# Patient Record
Sex: Male | Born: 1990 | Race: White | Hispanic: No | Marital: Married | Smoking: Never smoker
Health system: Southern US, Community
[De-identification: ages and names within clinical notes are randomized; demographics above are authoritative.]

## PROBLEM LIST (undated history)

## (undated) HISTORY — PX: WISDOM TOOTH EXTRACTION: SHX21

## (undated) HISTORY — PX: SHOULDER ARTHROSCOPY W/ LABRAL REPAIR: SHX2399

---
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
## 2011-12-11 ENCOUNTER — Ambulatory Visit
Admission: RE | Admit: 2011-12-11 | Discharge: 2011-12-11 | Disposition: A | Discharge status: Home / Self Care | Payer: No Typology Code available for payment source | Source: Ambulatory Visit | Attending: Occupational Medicine | Admitting: Occupational Medicine

## 2011-12-11 ENCOUNTER — Other Ambulatory Visit: Payer: Self-pay | Admitting: Occupational Medicine

## 2011-12-11 DIAGNOSIS — Z021 Encounter for pre-employment examination: Secondary | ICD-10-CM

## 2012-06-01 ENCOUNTER — Encounter (HOSPITAL_COMMUNITY): Payer: Self-pay | Admitting: *Deleted

## 2012-06-01 ENCOUNTER — Emergency Department (HOSPITAL_COMMUNITY)
Admission: EM | Admit: 2012-06-01 | Discharge: 2012-06-01 | Disposition: A | Discharge status: Home / Self Care | Payer: 59 | Source: Home / Self Care | Attending: Family Medicine | Admitting: Family Medicine

## 2012-06-01 DIAGNOSIS — F488 Other specified nonpsychotic mental disorders: Secondary | ICD-10-CM

## 2012-06-01 LAB — GLUCOSE, CAPILLARY: Glucose-Capillary: 93 mg/dL (ref 70–99)

## 2012-06-01 LAB — POCT I-STAT, CHEM 8
Creatinine, Ser: 1.3 mg/dL (ref 0.50–1.35)
HCT: 49 % (ref 39.0–52.0)
Hemoglobin: 16.7 g/dL (ref 13.0–17.0)
Potassium: 3.4 mEq/L — ABNORMAL LOW (ref 3.5–5.1)
Sodium: 143 mEq/L (ref 135–145)

## 2012-06-01 NOTE — ED Provider Notes (Signed)
History     CSN: [id number]  Arrival date & time 06/01/12  4098   First MD Initiated Contact with Patient 06/01/12 1838      Chief Complaint  Patient presents with  . Polydipsia  . Fatigue    (Consider location/radiation/quality/duration/timing/severity/associated sxs/prior treatment) Patient is a 21 y.o. male presenting with general illness. The history is provided by the patient.  Illness  The current episode started more than 2 weeks ago. The onset was gradual. The problem has been unchanged. The problem is moderate. Pertinent negatives include no fever and no headaches. Associated symptoms comments: Fatigue, polydipsia, polyuria, is in [employer] training program., no energy drinks or caffeine, gets plenty of sleep.Marland Kitchen    History reviewed. No pertinent past medical history.  History reviewed. No pertinent past surgical history.  No family history on file.  History  Substance Use Topics  . Smoking status: Never Smoker   . Smokeless tobacco: Not on file  . Alcohol Use: Yes     Comment: "very rarely"      Review of Systems  Constitutional: Positive for fatigue. Negative for fever.  Neurological: Negative for dizziness, weakness, numbness and headaches.  Psychiatric/Behavioral: Negative.     Allergies  Review of patient's allergies indicates no known allergies.  Home Medications   Current Outpatient Rx  Name  Route  Sig  Dispense  Refill  . IBUPROFEN PO   Oral   Take by mouth.           BP 123/83  Pulse 82  Temp 98.3 F (36.8 C) (Oral)  Resp 18  SpO2 100%  Physical Exam  Nursing note and vitals reviewed. Constitutional: He is oriented to person, place, and time. He appears well-developed and well-nourished. No distress.  HENT:  Head: Normocephalic.  Mouth/Throat: Oropharynx is clear and moist.  Eyes: Conjunctivae normal are normal. Pupils are equal, round, and reactive to light.  Neck: Normal range of motion. Neck supple.  Cardiovascular: Normal rate,  normal heart sounds and intact distal pulses.   Pulmonary/Chest: Breath sounds normal.  Abdominal: Soft. Bowel sounds are normal.  Musculoskeletal: He exhibits no edema.  Lymphadenopathy:    He has no cervical adenopathy.  Neurological: He is alert and oriented to person, place, and time.  Skin: Skin is warm and dry.    ED Course  Procedures (including critical care time)  Labs Reviewed  POCT I-STAT, CHEM 8 - Abnormal; Notable for the following:    Potassium 3.4 (*)     Calcium, Ion 1.25 (*)     All other components within normal limits  GLUCOSE, CAPILLARY   No results found.   1. Nervous fatigue       MDM  cbg--wnl at 93 U/a wnl i-stat-wnl.       Linna Hoff, MD 06/01/12 2013

## 2012-06-01 NOTE — ED Notes (Signed)
C/O feeling very fatigued despite adequate sleep over past 2-3 wks, along with excessive thirst and an increase in frequency of urination; has been having to wake in middle of night to urinate.  Last night woke in middle of night "completely drenched in sweat".

## 2012-06-02 LAB — POCT URINALYSIS DIP (DEVICE)
Bilirubin Urine: NEGATIVE
Glucose, UA: NEGATIVE mg/dL
Ketones, ur: NEGATIVE mg/dL
Leukocytes, UA: NEGATIVE

## 2012-10-07 IMAGING — CR DG CHEST 1V
2 series · 2 of 2 positions shown · non-contrast
Comparison: None.

CLINICAL DATA: Pre employment physical exam

CHEST - 1 VIEW

[view not recorded (1 of 2)]
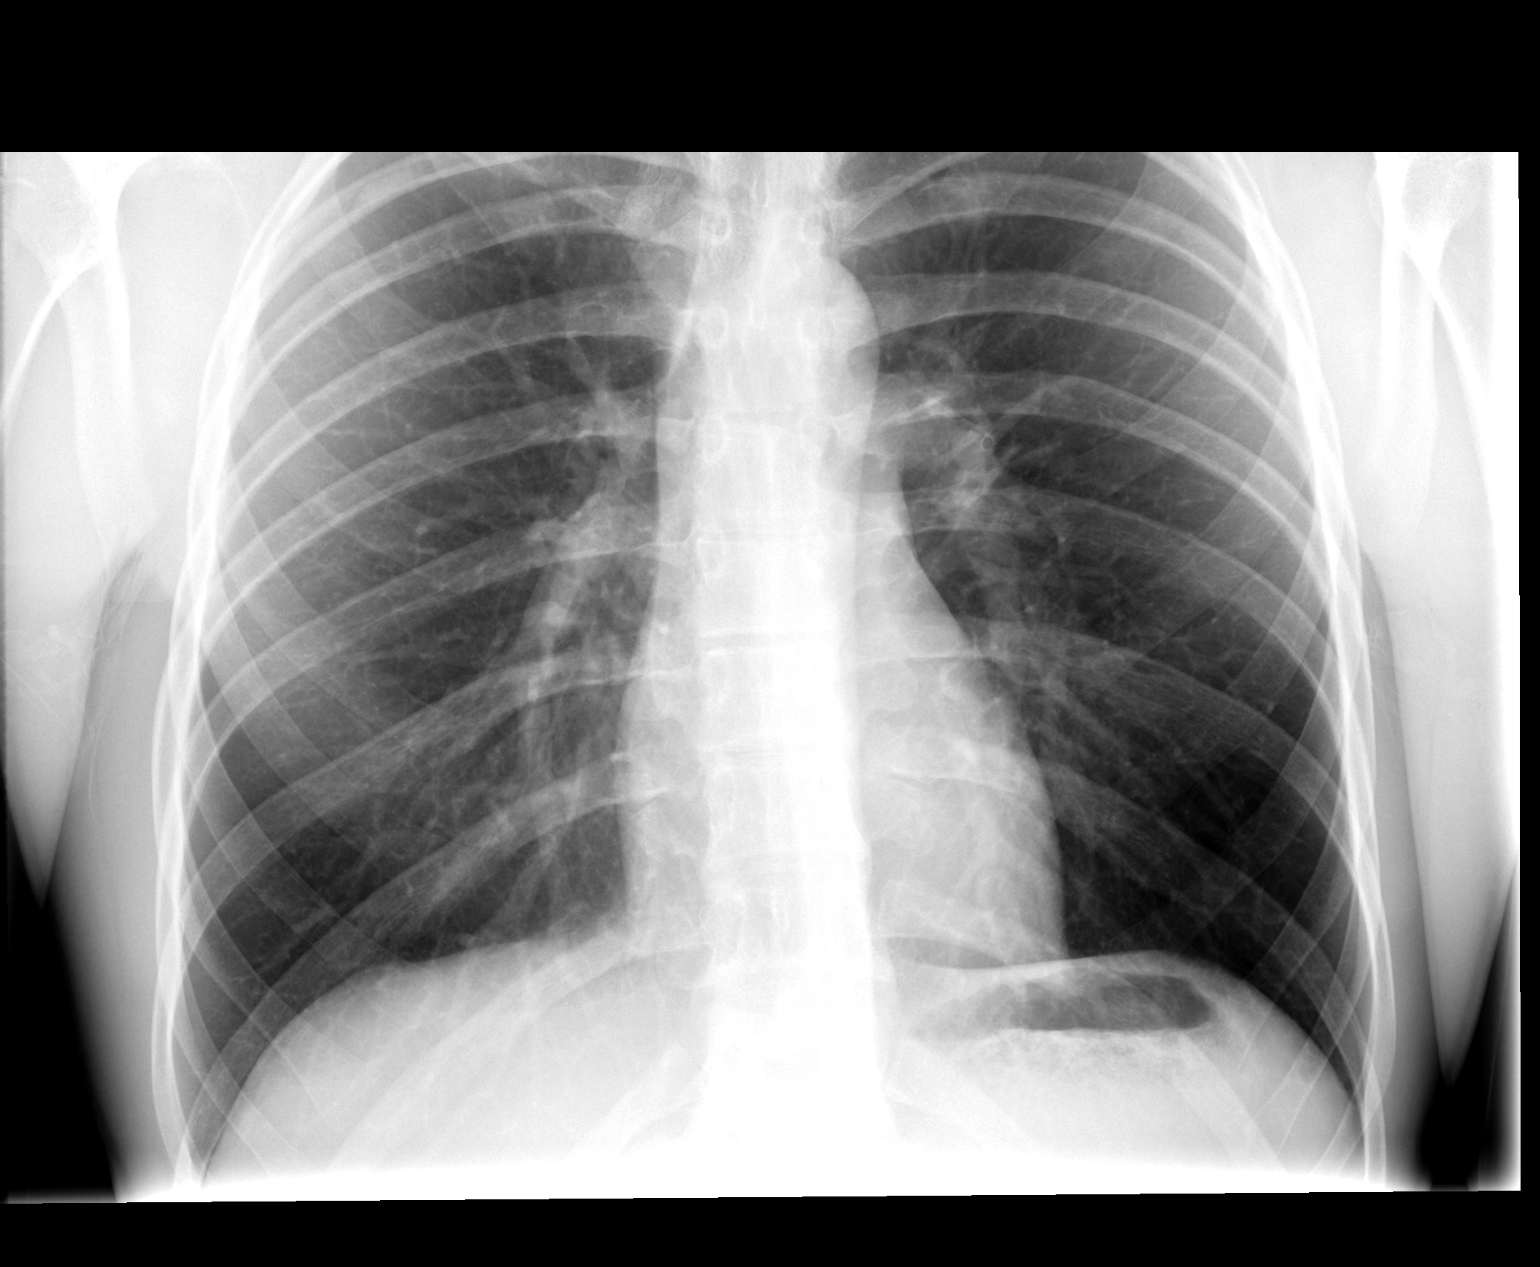

[view not recorded (2 of 2)]
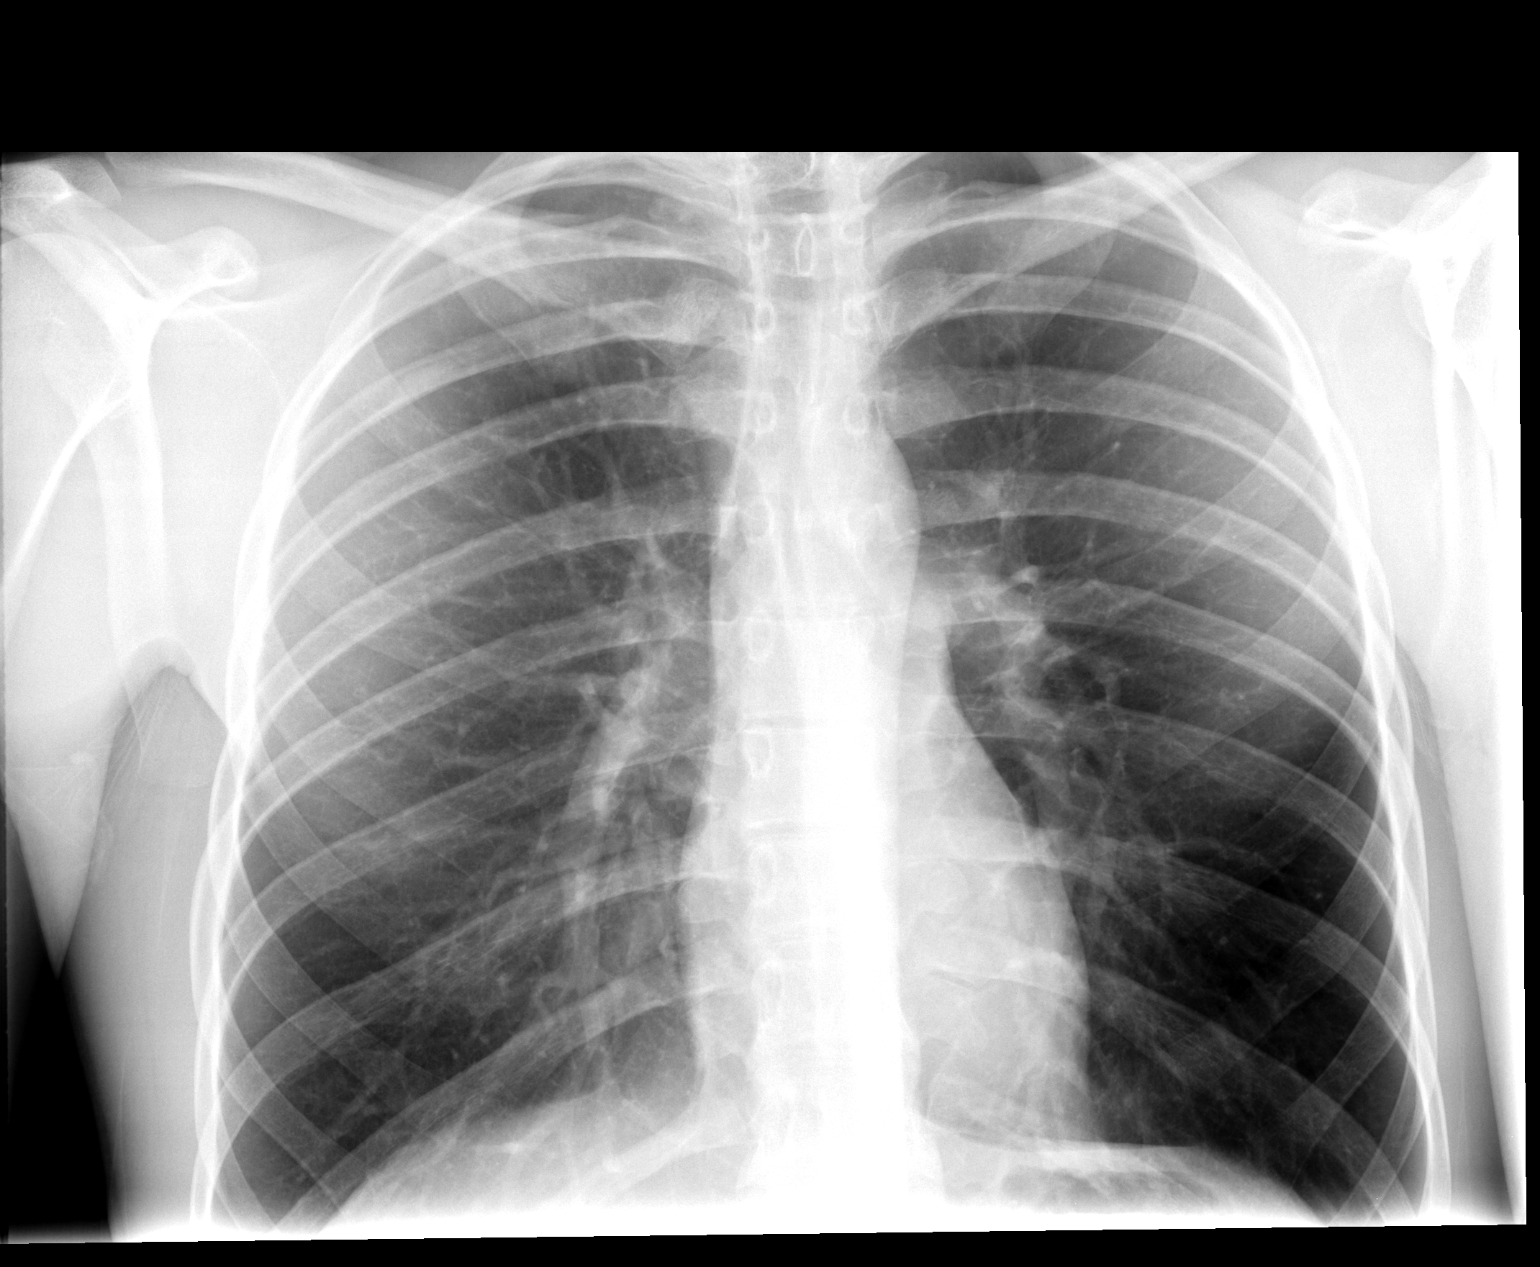

[2 of 2 positions shown; findings below may reference images not displayed]

FINDINGS: The lungs are clear.  Mediastinal contours appear normal.
The heart is within normal limits in size.  No acute bony
abnormality is seen.  There is a sclerotic focus within the
posterior right fourth rib most consistent with a benign process.
IMPRESSION: No active lung disease.

## 2016-07-22 ENCOUNTER — Ambulatory Visit
Admission: RE | Admit: 2016-07-22 | Discharge: 2016-07-22 | Disposition: A | Discharge status: Home / Self Care | Payer: Worker's Compensation | Source: Ambulatory Visit | Attending: Nurse Practitioner | Admitting: Nurse Practitioner

## 2016-07-22 ENCOUNTER — Other Ambulatory Visit: Payer: Self-pay | Admitting: Nurse Practitioner

## 2016-07-22 DIAGNOSIS — R52 Pain, unspecified: Secondary | ICD-10-CM

## 2016-07-22 DIAGNOSIS — R609 Edema, unspecified: Secondary | ICD-10-CM

## 2016-12-24 ENCOUNTER — Ambulatory Visit (HOSPITAL_COMMUNITY)
Admission: EM | Admit: 2016-12-24 | Discharge: 2016-12-24 | Disposition: A | Discharge status: Home / Self Care | Payer: Commercial Managed Care - HMO | Attending: Family Medicine | Admitting: Family Medicine

## 2016-12-24 ENCOUNTER — Encounter (HOSPITAL_COMMUNITY): Payer: Self-pay | Admitting: *Deleted

## 2016-12-24 DIAGNOSIS — R0789 Other chest pain: Secondary | ICD-10-CM

## 2016-12-24 MED ORDER — NAPROXEN 500 MG PO TABS: 500.0000 mg | ORAL_TABLET | Freq: Two times a day (BID) | ORAL | 0 refills | Status: DC

## 2016-12-24 NOTE — ED Provider Notes (Signed)
CSN: [id number]     Arrival date & time 12/24/16  1044 History   None    Chief Complaint  Patient presents with  . Chest Pain   (Consider location/radiation/quality/duration/timing/severity/associated sxs/prior Treatment) Patient c/o left sided chest wall pain x 3 days.  Pain is worse with cough or taking deep breathe.   The history is provided by the patient.  Chest Pain  Pain location:  L chest Pain quality: aching   Pain radiates to:  Does not radiate Pain severity:  Moderate Onset quality:  Sudden Duration:  3 days Timing:  Constant Progression:  Worsening Chronicity:  New Relieved by:  Nothing Worsened by:  Nothing   History reviewed. No pertinent past medical history. History reviewed. No pertinent surgical history. History reviewed. No pertinent family history. Social History  Substance Use Topics  . Smoking status: Never Smoker  . Smokeless tobacco: Not on file  . Alcohol use Yes     Comment: "very rarely"    Review of Systems  Constitutional: Negative.   HENT: Negative.   Eyes: Negative.   Respiratory: Negative.   Cardiovascular: Positive for chest pain.  Gastrointestinal: Negative.   Endocrine: Negative.   Genitourinary: Negative.   Musculoskeletal: Negative.   Allergic/Immunologic: Negative.   Neurological: Negative.   Hematological: Negative.   Psychiatric/Behavioral: Negative.     Allergies  Patient has no known allergies.  Home Medications   Prior to Admission medications   Medication Sig Start Date End Date Taking? Authorizing Provider  IBUPROFEN PO Take by mouth.    [provider]  naproxen (NAPROSYN) 500 MG tablet Take 1 tablet (500 mg total) by mouth 2 (two) times daily with a meal. 12/24/16   Oxford, Anselm PancoastWilliam J, FNP   Meds Ordered and Administered this Visit  Medications - No data to display  BP 132/84 (BP Location: Left Arm)   Pulse 84   Temp 98 F (36.7 C)   Resp 18   SpO2 100%  No data found.   Physical Exam   Constitutional: He appears well-developed and well-nourished.  HENT:  Head: Normocephalic and atraumatic.  Eyes: Conjunctivae and EOM are normal. Pupils are equal, round, and reactive to light.  Neck: Normal range of motion. Neck supple.  Cardiovascular: Normal rate, regular rhythm and normal heart sounds.   Pulmonary/Chest: Effort normal and breath sounds normal.  Musculoskeletal: He exhibits tenderness.  TTP left anterior chest wall.  Nursing note and vitals reviewed.   Urgent Care Course     Procedures (including critical care time)  Labs Review Labs Reviewed - No data to display  Imaging Review No results found.   Visual Acuity Review  Right Eye Distance:   Left Eye Distance:   Bilateral Distance:    Right Eye Near:   Left Eye Near:    Bilateral Near:         MDM   1. Chest wall pain    Naprosyn one po bid x 10 days #20      Deatra CanterOxford, William J, FNP 12/24/16 1228

## 2016-12-24 NOTE — ED Triage Notes (Signed)
Pt  Reports   l  Sided   Chest  Pain   X   3  Days   Cramping  Type  Pain   At  First        Pain  Is   Worse   And   When takes  A  Deep  Breath  On  Certain  posistions

## 2017-05-19 IMAGING — CR DG FINGER LITTLE 2+V*R*
3 series · 3 of 3 positions shown · non-contrast
Comparison: None.

CLINICAL DATA: Closed car door on right fifth finger with pain to
the nail bed

EXAM:
RIGHT LITTLE FINGER 2+V

[x finger pa right]
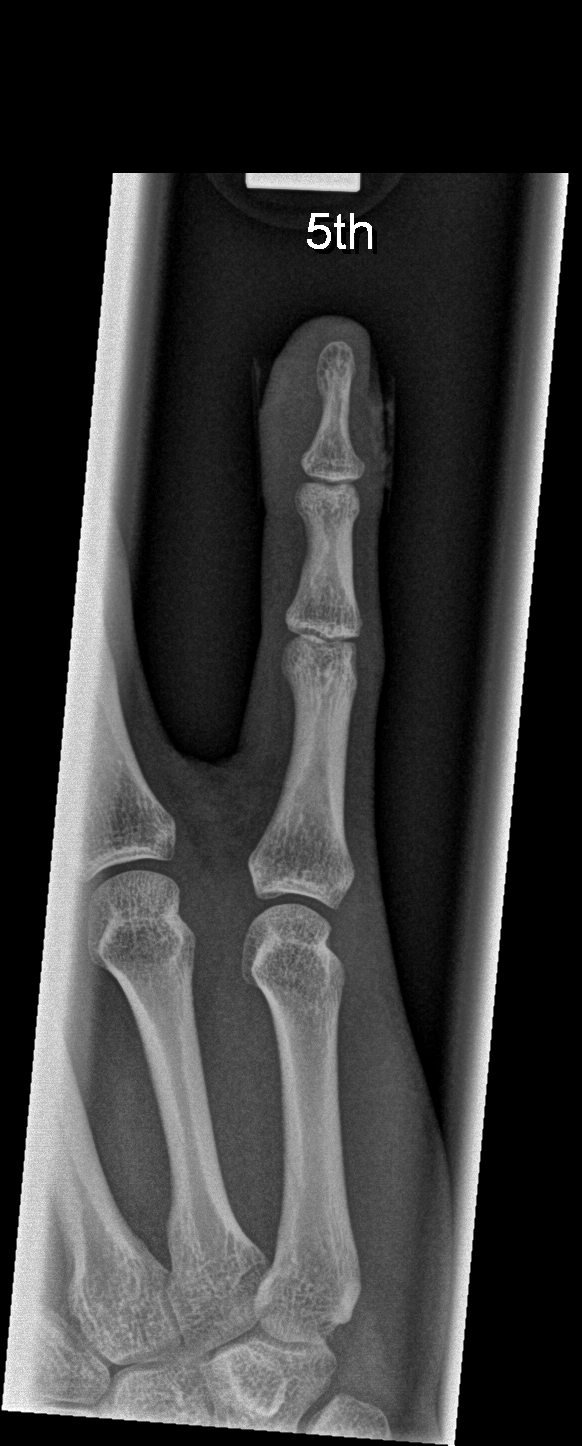

[x finger obl right]
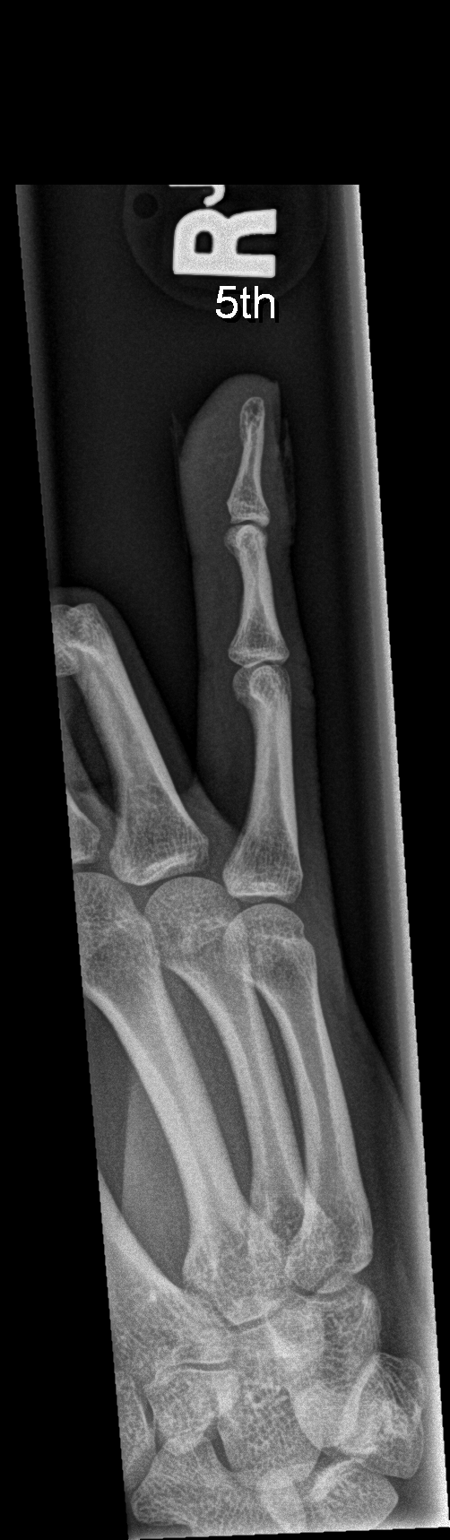

[x finger lat right]
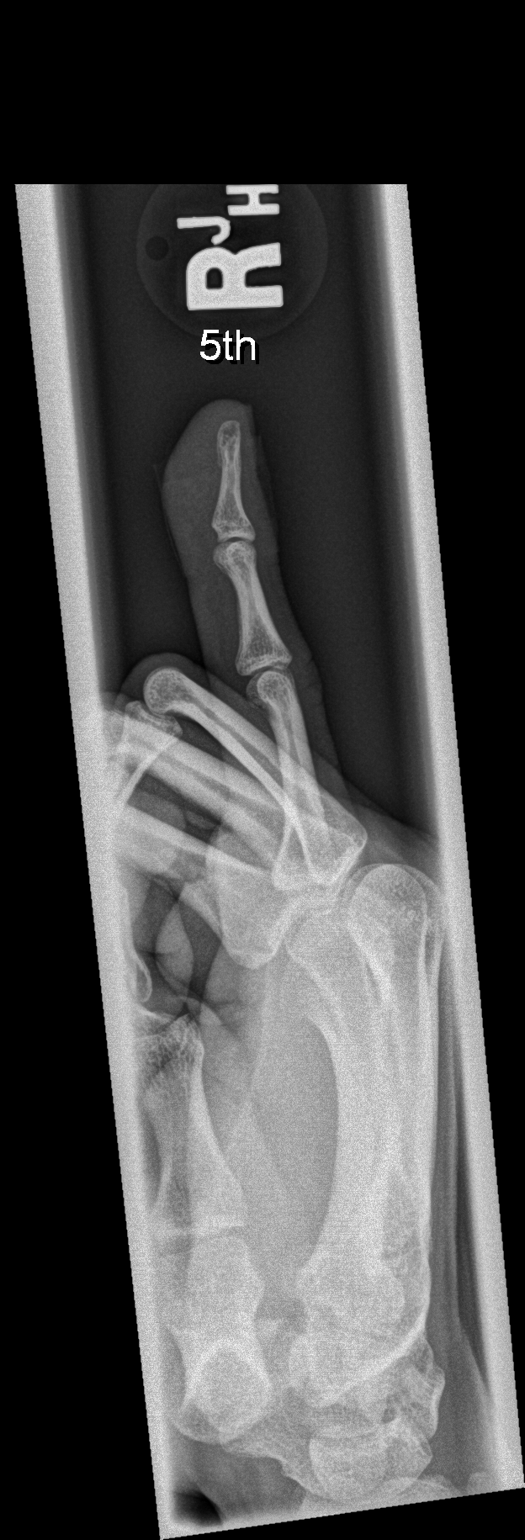

[3 of 3 positions shown; findings below may reference images not displayed]

FINDINGS: There is no evidence of fracture or dislocation. There is no
evidence of arthropathy or other focal bone abnormality. Soft
tissues are unremarkable.
IMPRESSION: Negative.

## 2019-10-02 ENCOUNTER — Ambulatory Visit: Payer: Self-pay | Attending: Internal Medicine

## 2019-10-02 DIAGNOSIS — Z23 Encounter for immunization: Secondary | ICD-10-CM

## 2019-10-02 NOTE — Progress Notes (Signed)
   Covid-19 Vaccination Clinic  Name:  MANUS WEEDMAN    MRN: [id number] DOB: 04-28-1991  10/02/2019  Mr. Hinkle was observed post Covid-19 immunization for 15 minutes without incident. He was provided with Vaccine Information Sheet and instruction to access the V-Safe system.   Mr. Salemi was instructed to call 911 with any severe reactions post vaccine: Marland Kitchen Difficulty breathing  . Swelling of face and throat  . A fast heartbeat  . A bad rash all over body  . Dizziness and weakness   Immunizations Administered    Name Date Dose VIS Date Route   Pfizer COVID-19 Vaccine 10/02/2019  8:56 AM 0.3 mL 06/25/2019 Intramuscular   Manufacturer: [employer], [employer]   Lot: [id number]   NDC: [address]-5

## 2019-10-26 ENCOUNTER — Ambulatory Visit: Payer: Self-pay | Attending: Internal Medicine

## 2019-10-26 DIAGNOSIS — Z23 Encounter for immunization: Secondary | ICD-10-CM

## 2019-10-26 NOTE — Progress Notes (Signed)
   Covid-19 Vaccination Clinic  Name:  Steven Bradford    MRN: [id number] DOB: [birth year]  10/26/2019  Steven Bradford was observed post Covid-19 immunization for 15 minutes without incident. He was provided with Vaccine Information Sheet and instruction to access the V-Safe system.   Steven Bradford was instructed to call 911 with any severe reactions post vaccine: Marland Kitchen Difficulty breathing  . Swelling of face and throat  . A fast heartbeat  . A bad rash all over body  . Dizziness and weakness   Immunizations Administered    Name Date Dose VIS Date Route   Pfizer COVID-19 Vaccine 10/26/2019  2:55 PM 0.3 mL 06/25/2019 Intramuscular   Manufacturer: [employer], [employer]   Lot: [id number]   NDC: [address]-1

## 2020-07-18 ENCOUNTER — Encounter: Payer: Self-pay | Admitting: Family Medicine

## 2020-07-18 ENCOUNTER — Other Ambulatory Visit: Payer: Self-pay

## 2020-07-18 ENCOUNTER — Ambulatory Visit (INDEPENDENT_AMBULATORY_CARE_PROVIDER_SITE_OTHER): Payer: Managed Care, Other (non HMO) | Admitting: Family Medicine

## 2020-07-18 VITALS — BP 124/82 | HR 83 | Temp 98.4°F | Ht 73.9 in | Wt 196.5 lb

## 2020-07-18 DIAGNOSIS — M722 Plantar fascial fibromatosis: Secondary | ICD-10-CM | POA: Diagnosis not present

## 2020-07-18 NOTE — Patient Instructions (Signed)
Plantar fasciits hand out  - call if not improving and can try physical therapy

## 2020-07-18 NOTE — Progress Notes (Signed)
   Subjective:     Steven Bradford is a 30 y.o. male presenting for Establish Care and Foot Pain (Both heels but L is more painful, since October )     HPI   #Foot pain - hx of foot fracture which healed fine - L>R - heel pain - started in October  - took a vacation and then feels like it started when he went back to work - initially both heels, right has improved - pain is first thing in the morning - improves if walking around or wearing shoes - tried ice, rest, ibuprofen    Review of Systems   Social History   Tobacco Use  Smoking Status Never Smoker  Smokeless Tobacco Never Used        Objective:    BP Readings from Last 3 Encounters:  07/18/20 124/82  12/24/16 132/84  06/01/12 123/83   Wt Readings from Last 3 Encounters:  07/18/20 196 lb 8 oz (89.1 kg)    BP 124/82   Pulse 83   Temp 98.4 F (36.9 C) (Temporal)   Ht 6' 1.9" (1.877 m)   Wt 196 lb 8 oz (89.1 kg)   SpO2 98%   BMI 25.30 kg/m    Physical Exam Constitutional:      Appearance: Normal appearance. He is not ill-appearing or diaphoretic.  HENT:     Right Ear: External ear normal.     Left Ear: External ear normal.     Nose: Nose normal.  Eyes:     General: No scleral icterus.    Extraocular Movements: Extraocular movements intact.     Conjunctiva/sclera: Conjunctivae normal.  Cardiovascular:     Rate and Rhythm: Normal rate and regular rhythm.     Heart sounds: No murmur heard.   Pulmonary:     Effort: Pulmonary effort is normal. No respiratory distress.     Breath sounds: Normal breath sounds. No wheezing.  Musculoskeletal:     Cervical back: Neck supple.     Comments: Left foot Inspection: normal Palpation: ttp along the lateral plantar fascia ROM: normal Strength: normal Ligaments: normal  Skin:    General: Skin is warm and dry.  Neurological:     Mental Status: He is alert. Mental status is at baseline.  Psychiatric:        Mood and Affect: Mood normal.            Assessment & Plan:   Problem List Items Addressed This Visit      Musculoskeletal and Integument   Plantar fasciitis, left - Primary    Home exercises. PT if not improved.           Return in about 1 year (around 07/18/2021) for annual visit.  Lynnda Child, MD  This visit occurred during the SARS-CoV-2 public health emergency.  Safety protocols were in place, including screening questions prior to the visit, additional usage of staff PPE, and extensive cleaning of exam room while observing appropriate contact time as indicated for disinfecting solutions.

## 2020-07-18 NOTE — Assessment & Plan Note (Signed)
Home exercises. PT if not improved.

## 2020-11-16 ENCOUNTER — Telehealth: Payer: 59 | Admitting: Physician Assistant

## 2020-11-16 ENCOUNTER — Encounter: Payer: Self-pay | Admitting: Physician Assistant

## 2020-11-16 DIAGNOSIS — Z8619 Personal history of other infectious and parasitic diseases: Secondary | ICD-10-CM

## 2020-11-16 DIAGNOSIS — K120 Recurrent oral aphthae: Secondary | ICD-10-CM | POA: Diagnosis not present

## 2020-11-16 MED ORDER — VALACYCLOVIR HCL 1 G PO TABS: 1000.0000 mg | ORAL_TABLET | Freq: Two times a day (BID) | ORAL | 0 refills | Status: AC

## 2020-11-16 NOTE — Progress Notes (Signed)
[name], Steven Bradford are scheduled for a virtual visit with your provider today.    Just as we do with appointments in the office, we must obtain your consent to participate.  Your consent will be active for this visit and any virtual visit you may have with one of our providers in the next 365 days.    If you have a MyChart account, I can also send a copy of this consent to you electronically.  All virtual visits are billed to your insurance company just like a traditional visit in the office.  As this is a virtual visit, video technology does not allow for your provider to perform a traditional examination.  This may limit your provider's ability to fully assess your condition.  If your provider identifies any concerns that need to be evaluated in person or the need to arrange testing such as labs, EKG, etc, we will make arrangements to do so.    Although advances in technology are sophisticated, we cannot ensure that it will always work on either your end or our end.  If the connection with a video visit is poor, we may have to switch to a telephone visit.  With either a video or telephone visit, we are not always able to ensure that we have a secure connection.   I need to obtain your verbal consent now.   Are you willing to proceed with your visit today?   Steven Bradford has provided verbal consent on 11/16/2020 for a virtual visit (video or telephone).   Steven Forth, PA-C 11/16/2020  7:23 PM   Date:  11/16/2020   ID:  Leanora Cover, DOB May 28, 1991, MRN [id number]  Patient Location: Home Provider Location: Home Office   Participants: Patient and Provider for Visit and Wrap up  Method of visit: Video  Location of Patient: Home Location of Provider: Home Office Consent was obtain for visit over the video. Services rendered by provider: Visit was performed via video  A video enabled telemedicine application was used and I verified that I am speaking with the correct person using two  identifiers.  PCP:  Lynnda Child, MD   Chief Complaint:  "cold sores"  History of Present Illness:    Steven Bradford is a 30 y.o. male with history as stated below. Presents video telehealth for an acute care visit  Onset of symptoms was 2 days and symptoms have been persistent and include: cold sore on the inside of the lower lip.  Pt reports it has not opened yet.  Pt reports he gets these frequently.  Years ago he was on Valtrex for this, but has not required a prescription in some time.    Denies having fevers, chills, shortness of breath, cough, chest pain, ear pain, sore throat or exposure to covid or other sick contacts.   Modifying factors include: abreva without improvement  No other aggravating or relieving factors.  No other c/o.  The patient does have symptoms concerning for COVID-19 infection (fever, chills, cough, or new shortness of breath).  Patient has not been tested for COVID during this illness.  Past Medical, Surgical, Social History, Allergies, and Medications have been Reviewed.  Patient Active Problem List   Diagnosis Date Noted  . Plantar fasciitis, left 07/18/2020    Social History   Tobacco Use  . Smoking status: Never Smoker  . Smokeless tobacco: Never Used  Substance Use Topics  . Alcohol use: Yes    Comment: weekend, 1 drink  Current Outpatient Medications:  .  valACYclovir (VALTREX) 1000 MG tablet, Take 1 tablet (1,000 mg total) by mouth 2 (two) times daily for 10 days., Disp: 20 tablet, Rfl: 0 .  IBUPROFEN PO, Take 600 mg by mouth as needed., Disp: , Rfl:    No Known Allergies   Review of Systems  Constitutional: Negative for chills and fever.  HENT: Negative for congestion, ear pain and sore throat.        Oral lesion  Eyes: Negative for blurred vision and double vision.  Respiratory: Negative for cough, shortness of breath and wheezing.   Cardiovascular: Negative for chest pain, palpitations and leg swelling.   Gastrointestinal: Negative for abdominal pain, diarrhea, nausea and vomiting.  Genitourinary: Negative for dysuria.  Musculoskeletal: Negative for myalgias.  Skin: Negative for rash.  Neurological: Negative for loss of consciousness, weakness and headaches.  Psychiatric/Behavioral: The patient is not nervous/anxious.    See HPI for history of present illness.  Physical Exam Constitutional:      General: He is not in acute distress.    Appearance: Normal appearance. He is well-developed. He is not ill-appearing.  HENT:     Head: Normocephalic and atraumatic.     Nose: Nose normal.     Mouth/Throat:     Comments: Small lesion on the left lower inner lip Eyes:     General: No scleral icterus.    Conjunctiva/sclera: Conjunctivae normal.  Pulmonary:     Effort: Pulmonary effort is normal.  Musculoskeletal:        General: Normal range of motion.     Cervical back: Normal range of motion.  Skin:    Coloration: Skin is not pale.  Neurological:     General: No focal deficit present.     Mental Status: He is alert.  Psychiatric:        Mood and Affect: Mood normal.               A&P   1. Aphthous ulcer  - consistent with previous outbreaks  - Valtrex 1000mg , BID x 10 days  2. Hx of herpes simplex infection  - has taken Valtrex in the past   Patient voiced understanding and agreement to plan.   Time:   Today, I have spent 15 minutes with the patient with telehealth technology discussing the above problems, reviewing the chart, previous notes, medications and orders.    Tests Ordered: No orders of the defined types were placed in this encounter.   Medication Changes: Meds ordered this encounter  Medications  . valACYclovir (VALTREX) 1000 MG tablet    Sig: Take 1 tablet (1,000 mg total) by mouth 2 (two) times daily for 10 days.    Dispense:  20 tablet    Refill:  0     Disposition:  Follow up urgent care or Er for signs of secondary  infection  Signed , PA-C  11/16/2020 7:30 PM

## 2020-11-16 NOTE — Patient Instructions (Addendum)
1. Aphthous ulcer  - consistent with previous outbreaks  - Valtrex 1000mg , BID x 10 days  2. Hx of herpes simplex infection  - has taken Valtrex in the past Canker Sores  Canker sores are small, painful sores that develop inside your mouth. You can get one or more canker sores on the inside of your lips or cheeks, on your tongue, or anywhere inside your mouth. Canker sores cannot be passed from person to person (are not contagious). These sores are different from the sores that you may get on the outside of your lips (cold sores or fever blisters). What are the causes? The cause of this condition is not known. The condition may be passed down from a parent (genetic). What increases the risk? This condition is more likely to develop in:  Women.  People in their teens or 38s.  Women who are having their menstrual period.  People who are under a lot of emotional stress.  People who do not get enough iron or B vitamins.  People who do not take care of their mouth and teeth (have poor oral hygiene).  People who have an injury inside the mouth, such as after having dental work or from chewing something hard. What are the signs or symptoms? Canker sores usually start as painful red bumps. Then they turn into small white, yellow, or gray sores that have red borders. The sores may be painful, and the pain may get worse when you eat or drink. Along with the canker sore, symptoms may also include:  Fever.  Fatigue.  Swollen lymph nodes in your neck. How is this diagnosed? This condition may be diagnosed based on your symptoms and an exam of the inside of your mouth. If you get canker sores often or if they are very bad, you may have tests, such as:  Blood tests to rule out possible causes.  Swabbing a fluid sample from the sore to be tested for infection.  Removing a small tissue sample from the sore (biopsy) to test it for cancer. How is this treated? Most canker sores go away  without treatment in about 1 week. Home care is usually the only treatment that you will need. Over-the-counter medicines can relieve discomfort. If you have severe canker sores, your health care provider may prescribe:  Numbing ointment to relieve pain. ? Do not use numbing gels or products containing benzocaine in children who are 61 years of age or younger.  Vitamins.  Steroid medicines. These may be given as pills, mouth rinses, or gels.  Antibiotic mouth rinse. Follow these instructions at home:  Apply, take, or use over-the-counter and prescription medicines only as told by your health care provider. These include vitamins and ointments.  If you were prescribed an antibiotic mouth rinse, use it as told by your health care provider. Do not stop using the antibiotic even if your condition improves.  Until the sores are healed: ? Do not drink coffee or citrus juices. ? Do not eat spicy or salty foods.  Use a mild, over-the-counter mouth rinse as recommended by your health care provider.  Practice good oral hygiene by: ? Flossing your teeth every day. ? Brushing your teeth with a soft toothbrush twice each day.   Contact a health care provider if:  Your symptoms do not get better after 2 weeks.  You also have a fever or swollen glands in your neck.  You get canker sores often.  You have a canker sore that is  getting larger.  You cannot eat or drink due to your canker sores. Summary  Canker sores are small, painful sores that develop inside your mouth.  Canker sores usually start as painful red bumps that turn into small white, yellow, or gray sores that have red borders.  The sores may be quite painful, and the pain may get worse when you eat or drink.  Most canker sores clear up without treatment in about 1 week. Over-the-counter medicines can relieve discomfort. This information is not intended to replace advice given to you by your health care provider. Make sure you  discuss any questions you have with your health care provider. Document Revised: 03/24/2020 Document Reviewed: 03/24/2020 Elsevier Patient Education  2021 [employer].

## 2021-05-14 DIAGNOSIS — Z3009 Encounter for other general counseling and advice on contraception: Secondary | ICD-10-CM | POA: Diagnosis not present
